# Patient Record
Sex: Female | Born: 1990 | Hispanic: No | Smoking: Never smoker
Health system: Southern US, Community
[De-identification: ages and names within clinical notes are randomized; demographics above are authoritative.]

---

## 2014-03-02 ENCOUNTER — Encounter: Payer: Self-pay | Admitting: Family

## 2014-03-02 ENCOUNTER — Other Ambulatory Visit (INDEPENDENT_AMBULATORY_CARE_PROVIDER_SITE_OTHER): Payer: Federal, State, Local not specified - PPO

## 2014-03-02 ENCOUNTER — Ambulatory Visit (INDEPENDENT_AMBULATORY_CARE_PROVIDER_SITE_OTHER): Payer: Federal, State, Local not specified - PPO | Admitting: Family

## 2014-03-02 VITALS — BP 112/78 | HR 87 | Temp 98.3°F | Resp 18 | Ht 59.0 in | Wt 135.1 lb

## 2014-03-02 DIAGNOSIS — R221 Localized swelling, mass and lump, neck: Secondary | ICD-10-CM

## 2014-03-02 LAB — T4, FREE: Free T4: 1.04 ng/dL (ref 0.60–1.60)

## 2014-03-02 LAB — TSH: TSH: 1.26 u[IU]/mL (ref 0.35–4.50)

## 2014-03-02 NOTE — Patient Instructions (Signed)
Thank you for choosing [facility].  Summary/Instructions:  Your prescription(s) have been submitted to your pharmacy or been printed and provided for you. Please take as directed and contact our office if you believe you are having problem(s) with the medication(s) or have any questions.  Please stop by the lab on the basement level of the building for your blood work. Your results will be released to MyChart (or called to you) after review, usually within 72 hours after test completion. If any changes need to be made, you will be notified at that same time.  Referrals have been made during this visit. You should expect to hear back from our schedulers in about 7-10 days in regards to establishing an appointment with the specialists we discussed.   If your symptoms worsen or fail to improve, please contact our office for further instruction, or in case of emergency go directly to the emergency room at the closest medical facility.    Goiter Goiter is an enlarged thyroid gland. The thyroid gland sits at the base of the front of the neck. The gland produces hormones that regulate mood, body temperature, pulse rate, and digestion. Most goiters are painless and are not a cause for serious concern. Goiters and conditions that cause goiters can be treated if necessary.  CAUSES  Common causes of goiter include:  Graves disease (causes too much hormone to be produced [hyperthyroidism]).  Hashimoto disease (causes too little hormone to be produced [hypothyroidism]).  Thyroiditis (inflammation of the thyroid sometimes caused by virus or pregnancy).  Nodular goiter (small bumps form; sometimes called toxic nodular goiter).  Pregnancy.  Thyroid cancer (very few goiters with nodules are cancerous).  Certain medications.  Radiation exposure.  Iodine deficiency (more common in developing countries in inland populations). RISK FACTORS Risk factors for goiter include:  A family history  of goiter.  Female gender.  Inadequate iodine in the diet.  Age older than 40 years. SYMPTOMS  Many goiters do not cause symptoms. When symptoms do occur, they may include:  Swelling in the lower part of the neck. This swelling can range from a very small bump to a large lump.  A tight feeling in the throat.  A hoarse voice. Less commonly, a goiter may result in:  Coughing.  Wheezing.  Difficulty swallowing.  Difficulty breathing.  Bulging neck veins.  Dizziness. When a goiter is the result of hyperthyroidism, symptoms may include:  Rapid or irregular heartbeat.  Sickness in your stomach (nausea).  Vomiting.  Diarrhea.  Shaking.  Irritable feeling.  Bulging eyes.  Weight loss.  Heat sensitivity.  Anxiety. When a goiter is the result of hypothyroidism, symptoms may include:  Tiredness.  Dry skin.  Constipation.  Weight gain.  Irregular menstrual cycle.  Depressed mood.  Sensitivity to cold. DIAGNOSIS  Tests used to diagnose goiter include:  A physical exam.  Blood tests, including thyroid hormone levels and antibody testing.  Ultrasonography, computerized X-ray scan (computed tomography, CT) or computerized magnetic scan (magnetic resonance imaging, MRI).  Thyroid scan (imaging along with safe radioactive injection).  Tissue sample taken (biopsy) of nodules. This is sometimes done to confirm that the nodules are not cancerous. TREATMENT  Treatment will depend on the cause of the goiter. Treatment may include:  Monitoring. In some cases, no treatment is necessary, and your doctor will monitor your condition at regular checkups.  Medications and supplements. Thyroid medication (thyroid hormone replacement) is available for hyperthyroidism and hypothyroidism.  If inflammation is the cause, over-the-counter medication  or steroid medication may be recommended.  Goiters caused by iodine deficiency can be treated with iodine supplements or  changes in diet.  Radioactive iodine treatment. Radioactive iodine is injected into the blood. It travels to the thyroid gland, kills thyroid cells, and reduces the size of the gland. This is only used when the thyroid gland is overactive. Lifelong thyroid hormone medication is often necessary after this treatment.  Surgery. A procedure to remove all or part of the gland may be recommended in severe cases or when cancer is the cause. Hormones can be taken to replace the hormones normally produced by the thyroid. HOME CARE INSTRUCTIONS   Take medications as directed.  Follow your caregiver's recommendations for any dietary changes.  Follow up with your caregiver for further examination and testing, as directed. PREVENTION   If you have a family history of goiter, discuss screening with your doctor.  Make sure you are getting enough iodine in your diet.  Use of iodized table salt can help prevent iodine deficiency. Document Released: 07/03/2009 Document Revised: 05/30/2013 Document Reviewed: 07/03/2009 [facility] Patient Information 2015 [place] [place]. This information is not intended to replace advice given to you by your health care provider. Make sure you discuss any questions you have with your health care provider.

## 2014-03-02 NOTE — Progress Notes (Signed)
   Subjective:    Patient ID: [place], female    DOB: January 26, 1991, 24 y.o.   MRN: [id number]  Chief Complaint  Patient presents with  . Establish Care    has lump in neck and wants to make sure it has nothing to do with thyroid, had TSH checked 2 weeks ago and it was normal    HPI:  Elizabeth NamStephanie Morken is a 24 y.o. female who presents today to establish care and discuss a lump in her neck.     Lump in neck - Associated symptom of a lump in the right side of her neck just superior to her clavical was noted about 3 weeks. Indicated that there has been pain, but the current pain intensity is 0/10. The size of the lump has been consistent and not really changed. Has been to Urgent Care where her TSH was normal. Denies changes to skin, hair or nails.    No Known Allergies  No current outpatient prescriptions on file prior to visit.   No current facility-administered medications on file prior to visit.    History reviewed. No pertinent past medical history.  History reviewed. No pertinent past surgical history.  Family History  Problem Relation Age of Onset  . Hyperlipidemia Mother   . Diabetes Mother   . Crohn's disease Father   . Heart disease Maternal Grandfather   . Goiter Paternal Grandmother     History   Social History  . Marital Status: Single    Spouse Name: N/A    Number of Children: 0  . Years of Education: 15   Occupational History  . 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 Smoking status: Never Smoker   . Smokeless tobacco: Never Used  . Alcohol Use: 0.0 oz/week    0 Not specified per week     Comment: Occasionally  . Drug Use: No  . Sexual Activity: Yes    Birth Control/ Protection: Pill   Other Topics Concern  . Not on file   Social History Narrative   [place]. Currently lives in an apartment with 1 roommate. No pets. Fun: video games.    Denies religious beliefs effecting health care.      Review of Systems  Constitutional: Negative for fever and chills.  Respiratory: Negative for choking and shortness of breath.   Endocrine: Negative for cold intolerance and heat intolerance.      Objective:    BP 112/78 mmHg  Pulse 87  Temp(Src) 98.3 F (36.8 C) (Oral)  Resp 18  Ht 4\' 11"  (1.499 m)  Wt 135 lb 1.9 oz (61.29 kg)  BMI 27.28 kg/m2  SpO2 98% Nursing note and vital signs reviewed.  Physical Exam  Constitutional: She is oriented to person, place, and time. She appears well-developed and well-nourished. No distress.  Neck: Thyroid mass present.    Cardiovascular: Normal rate, regular rhythm, normal heart sounds and intact distal pulses.   Pulmonary/Chest: Effort normal and breath sounds normal.  Neurological: She is alert and oriented to person, place, and time.  Skin: Skin is warm and dry.  Psychiatric: She has a normal mood and affect. Her behavior is normal. Judgment and thought content normal.       Assessment & Plan:

## 2014-03-02 NOTE — Progress Notes (Signed)
Pre visit review using our clinic review tool, if applicable. No additional management support is needed unless otherwise documented below in the visit note. 

## 2014-03-02 NOTE — Assessment & Plan Note (Signed)
Symptoms and exam consistent with potential goiter. Obtain TSH, T4, and neck ultrasound. Follow-up pending imaging and blood work.

## 2014-03-08 ENCOUNTER — Ambulatory Visit
Admission: RE | Admit: 2014-03-08 | Discharge: 2014-03-08 | Disposition: A | Discharge status: Home / Self Care | Payer: Federal, State, Local not specified - PPO | Source: Ambulatory Visit | Attending: Family | Admitting: Family

## 2014-03-08 DIAGNOSIS — R221 Localized swelling, mass and lump, neck: Secondary | ICD-10-CM

## 2014-03-09 ENCOUNTER — Encounter: Payer: Self-pay | Admitting: Family

## 2014-03-09 ENCOUNTER — Other Ambulatory Visit: Payer: Self-pay | Admitting: Family

## 2014-03-09 DIAGNOSIS — R221 Localized swelling, mass and lump, neck: Secondary | ICD-10-CM

## 2014-03-30 ENCOUNTER — Other Ambulatory Visit: Payer: Self-pay | Admitting: Family

## 2014-03-30 DIAGNOSIS — R221 Localized swelling, mass and lump, neck: Secondary | ICD-10-CM

## 2014-04-12 ENCOUNTER — Ambulatory Visit
Admission: RE | Admit: 2014-04-12 | Discharge: 2014-04-12 | Disposition: A | Discharge status: Home / Self Care | Payer: Federal, State, Local not specified - PPO | Source: Ambulatory Visit | Attending: Family | Admitting: Family

## 2014-04-12 ENCOUNTER — Other Ambulatory Visit (HOSPITAL_COMMUNITY)
Admission: RE | Admit: 2014-04-12 | Discharge: 2014-04-12 | Disposition: A | Discharge status: Home / Self Care | Payer: Federal, State, Local not specified - PPO | Source: Ambulatory Visit | Attending: Interventional Radiology | Admitting: Interventional Radiology

## 2014-04-12 DIAGNOSIS — E041 Nontoxic single thyroid nodule: Secondary | ICD-10-CM | POA: Diagnosis present

## 2014-04-12 DIAGNOSIS — R221 Localized swelling, mass and lump, neck: Secondary | ICD-10-CM

## 2014-04-26 ENCOUNTER — Encounter: Payer: Self-pay | Admitting: Family

## 2014-06-09 ENCOUNTER — Encounter: Payer: Self-pay | Admitting: Family

## 2014-07-05 ENCOUNTER — Other Ambulatory Visit: Payer: Self-pay

## 2016-12-14 IMAGING — US US SOFT TISSUE HEAD/NECK
1 series · 14 of 25 positions shown · non-contrast
Comparison: None.

CLINICAL DATA: Right neck mass.  Initial encounter.

EXAM:
THYROID ULTRASOUND
TECHNIQUE: Ultrasound examination of the thyroid gland and adjacent soft
tissues was performed.

[Series 1: us soft tissue head/neck · 0.07mm/px · 14 of 39 slices shown]
[im 1/39]
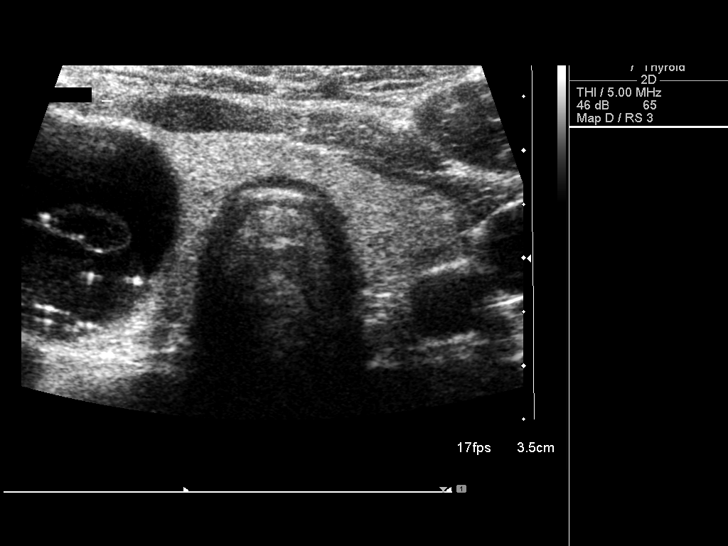
[im 4/39]
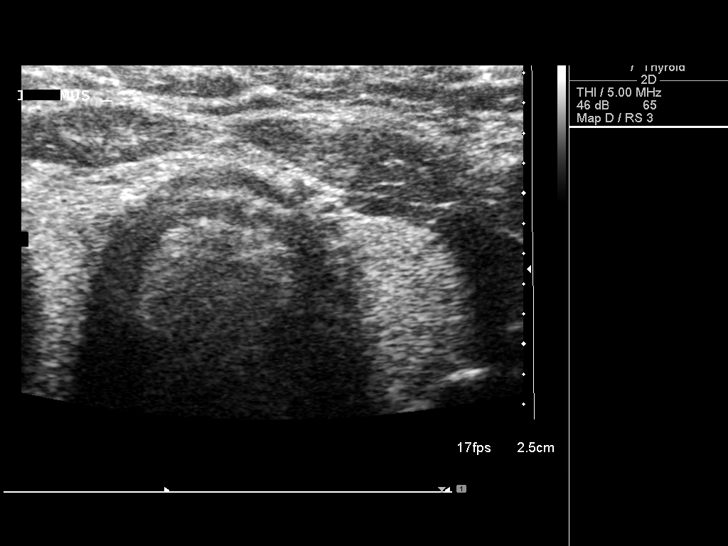
[im 7/39]
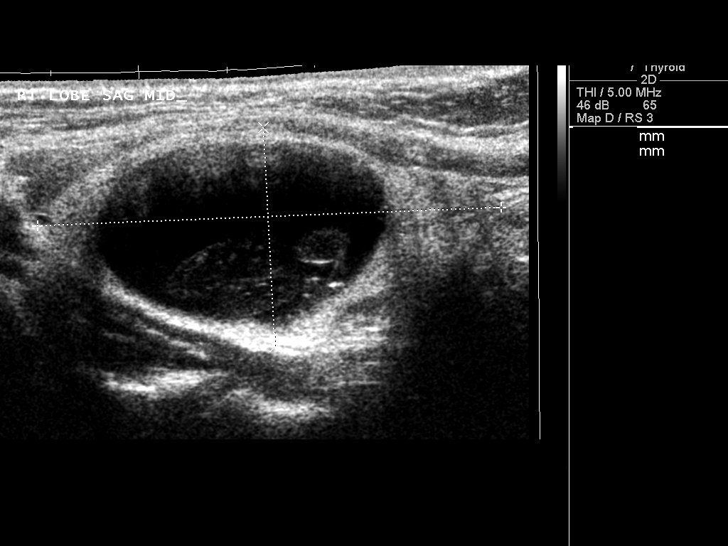
[im 10/39]
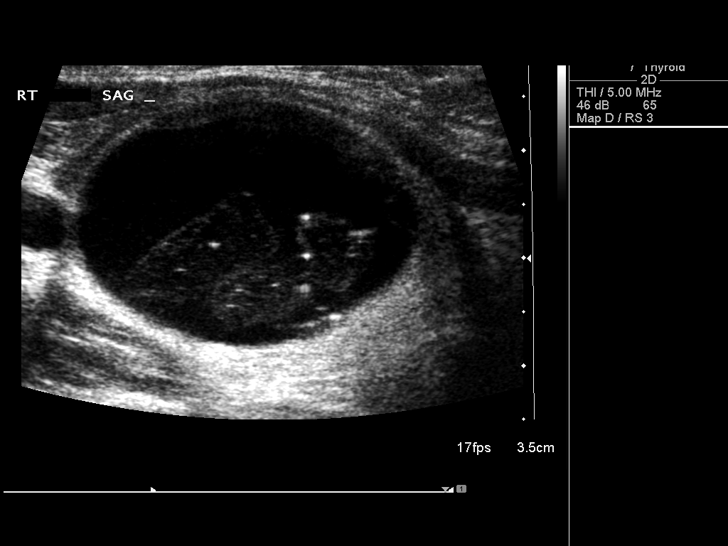
[im 13/39]
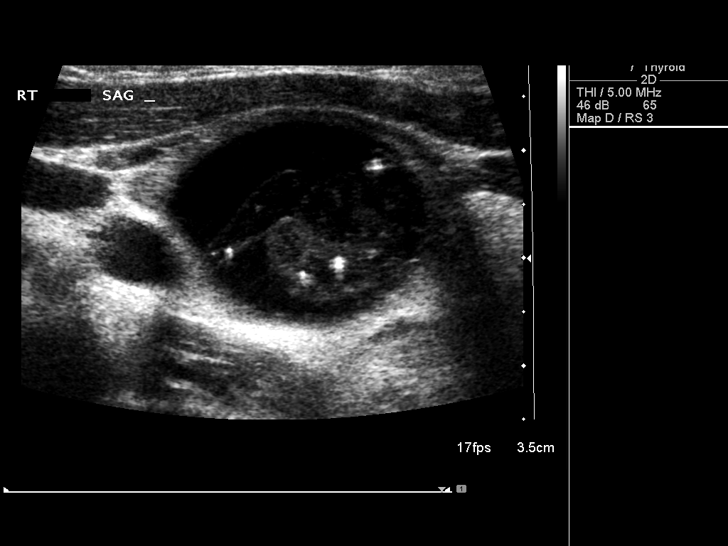
[im 15/39]
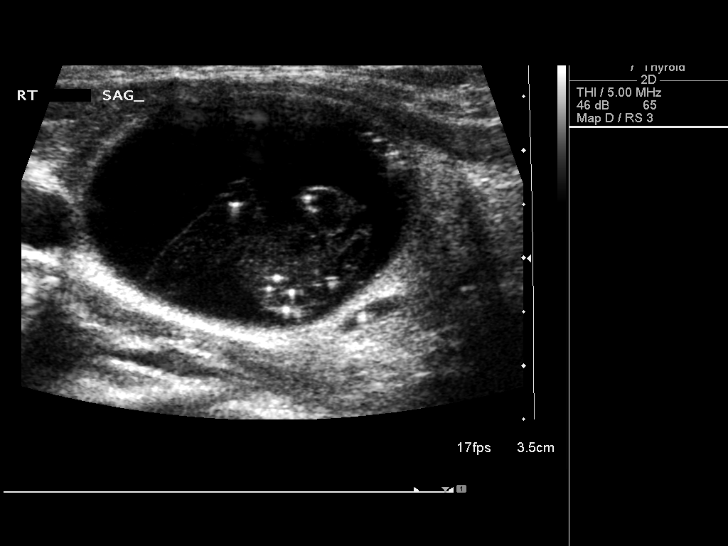
[im 18/39]
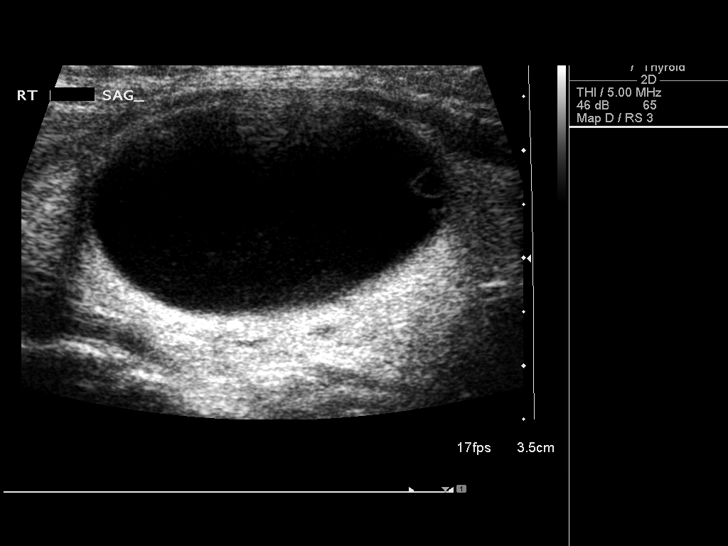
[im 21/39]
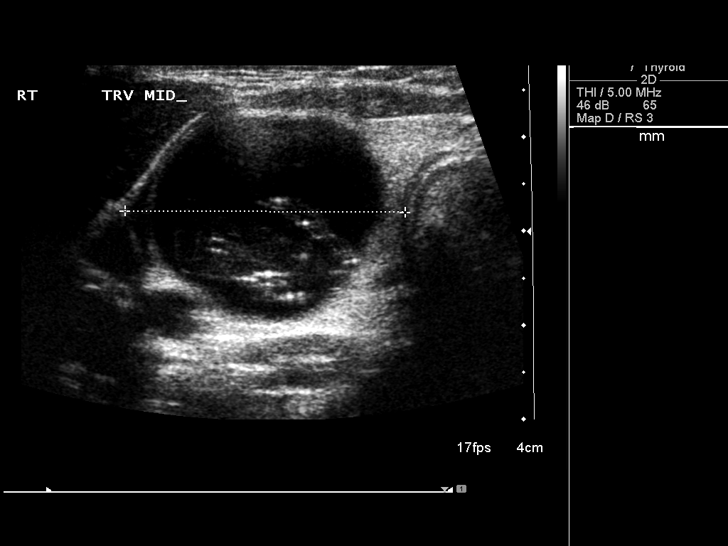
[im 24/39]
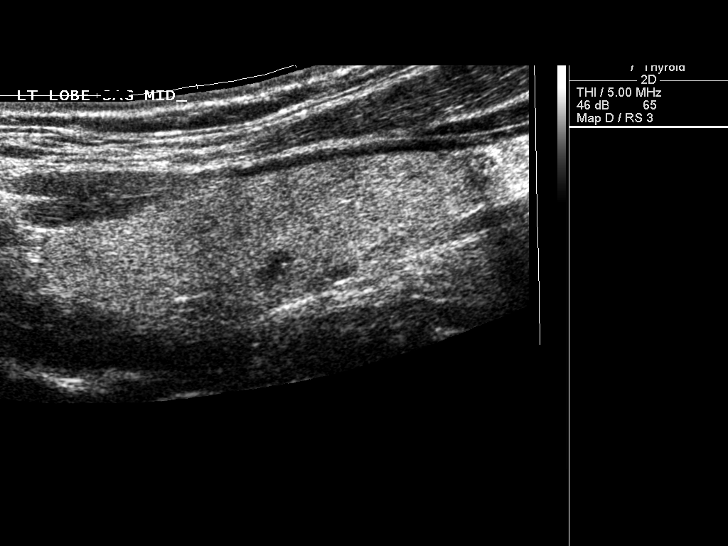
[im 26/39]
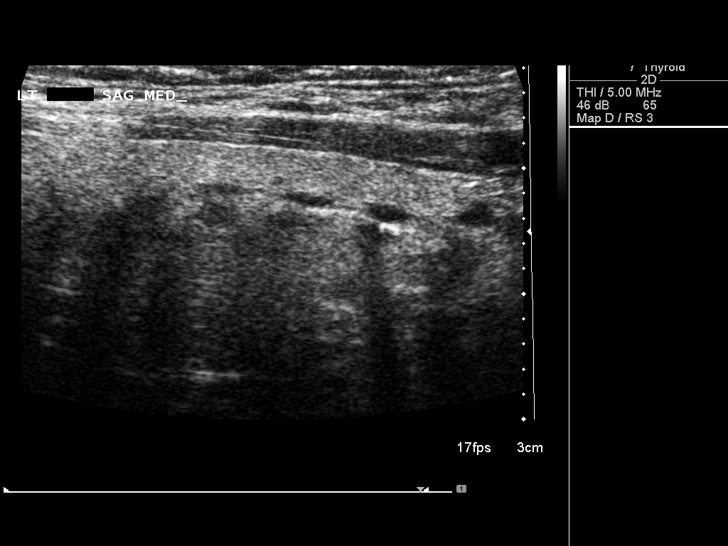
[im 29/39]
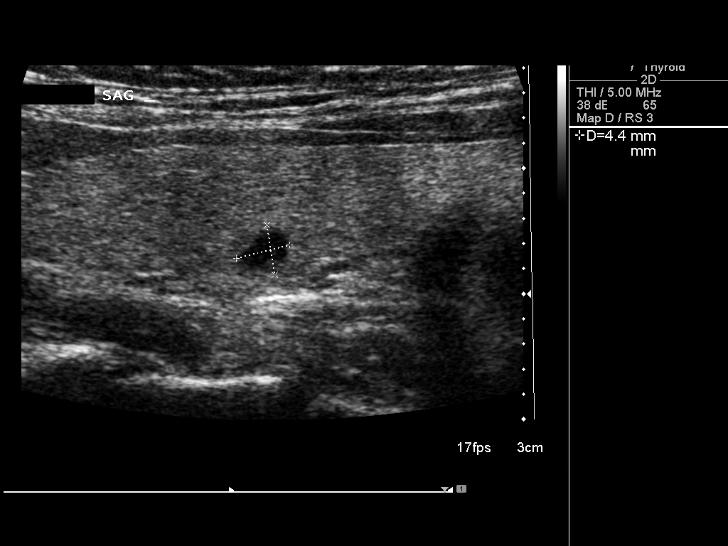
[im 32/39]
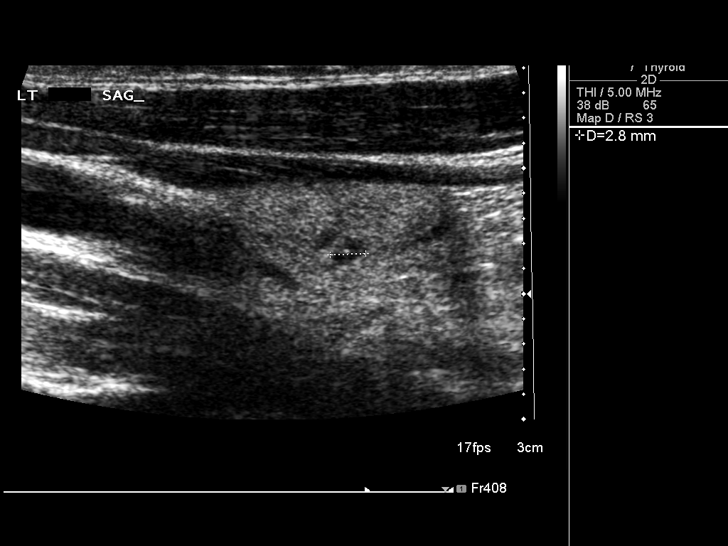
[im 35/39]
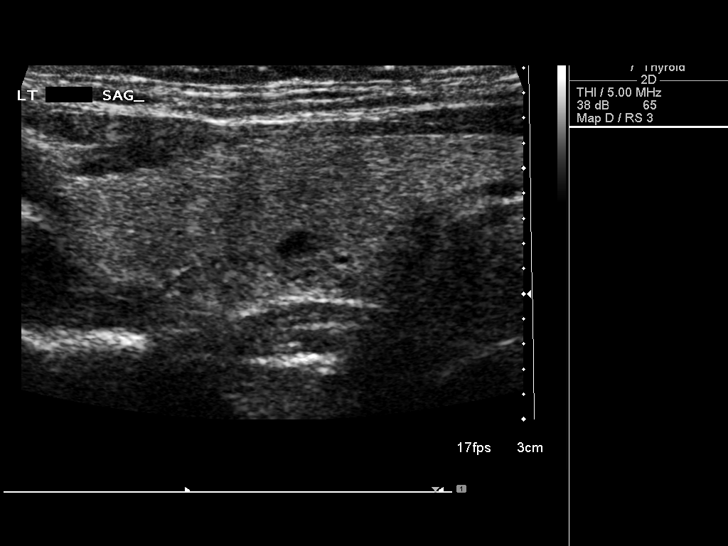
[im 39/39]
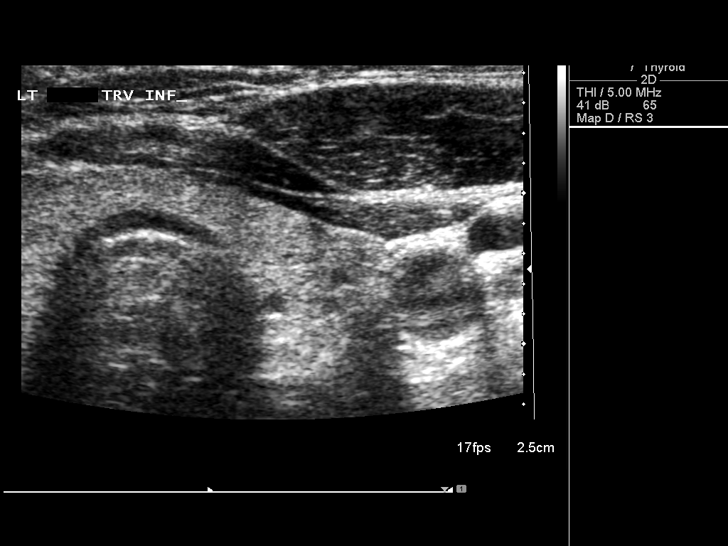

[14 of 25 positions shown; findings below may reference images not displayed]

FINDINGS: Right thyroid lobe

Measurements: 5.3 x 2.5 x 3.0 cm. Complex solid and cystic lesion
measures 3.2 x 2.3 x 2.6 cm. There are central echogenic areas.

Left thyroid lobe

Measurements: 4.7 x 1.4 x 1.3 cm. Tiny nodules 4 mm or less in
diameter.

Isthmus

Thickness: 4 mm.  No nodules visualized.

Lymphadenopathy

None visualized.
IMPRESSION: Bilateral thyroid lesions as described. 3.2 cm complex dominant
right lobe nodule. Findings meet consensus criteria for biopsy.
Ultrasound-guided fine needle aspiration should be considered, as
per the consensus statement: Management of Thyroid Nodules Detected
at US: Society of Radiologists in Ultrasound Consensus Conference

## 2017-01-18 IMAGING — US US THYROID BIOPSY
1 series · 12 of 12 positions shown · non-contrast
Comparison: Thyroid ultrasound on 03/08/2014

CLINICAL DATA: Complex cyst of the right lobe of the thyroid
previously measured at 3.2 cm in greatest diameter. Based on size
criteria, aspiration was recommended.

EXAM:
ULTRASOUND GUIDED NEEDLE ASPIRATE BIOPSY OF THE THYROID GLAND

[Series 1: us thyroid biopsy · 0.06mm/px · 12 acquisitions, 12 frames shown]
[im 1/12]
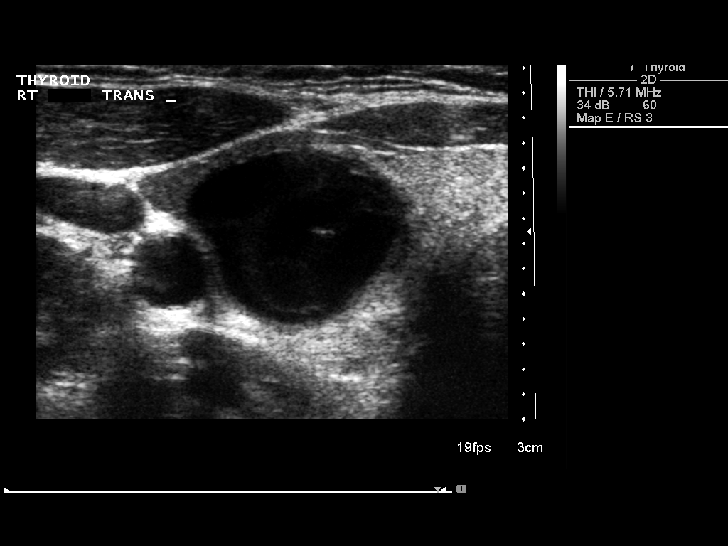
[im 2/12]
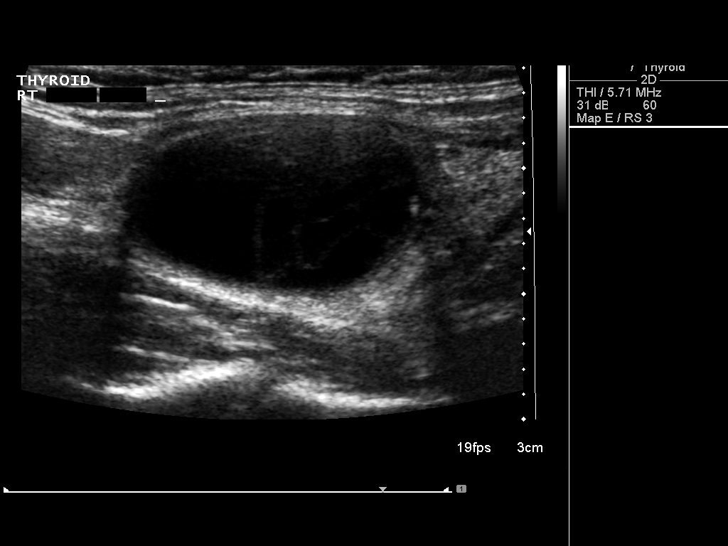
[im 3/12]
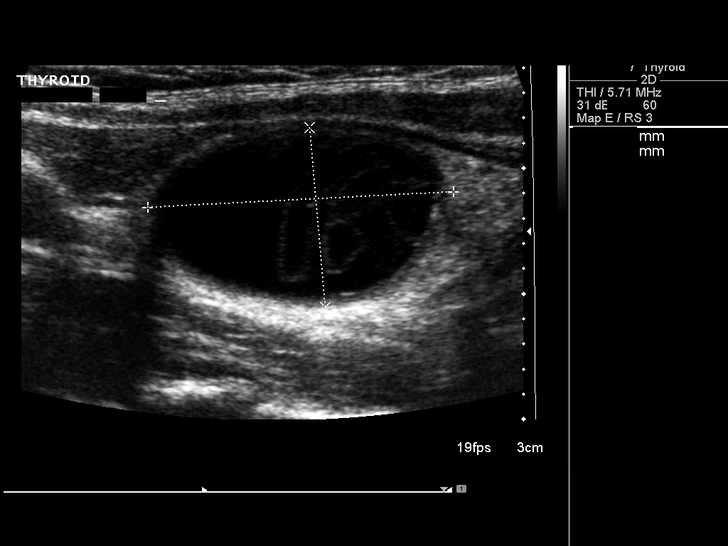
[im 4/12]
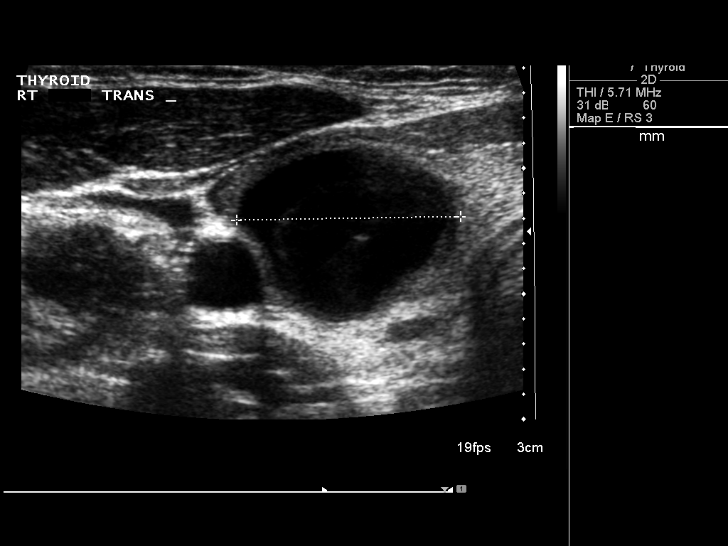
[im 5/12]
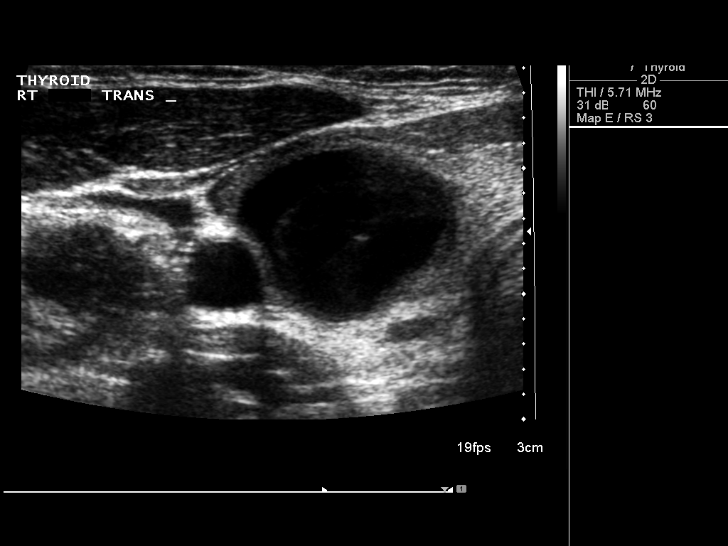
[im 6/12]
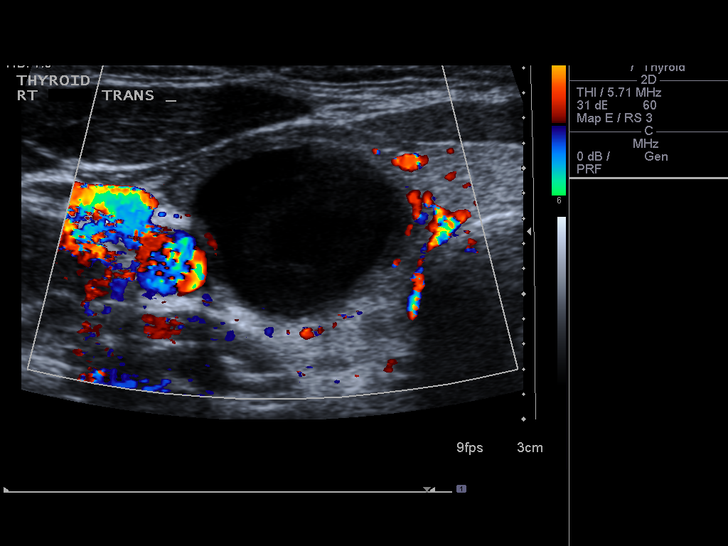
[im 7/12]
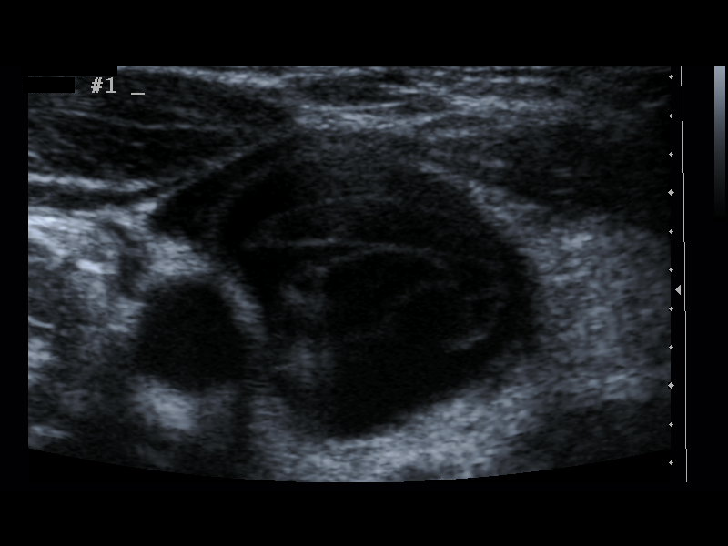
[im 8/12]
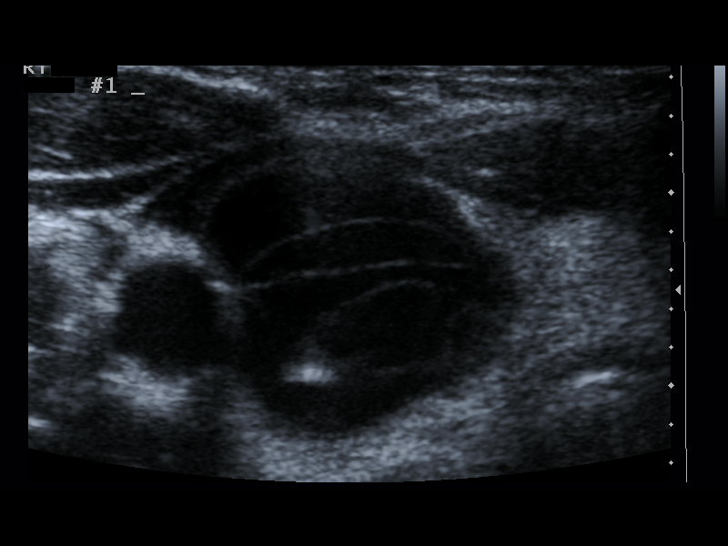
[im 9/12]
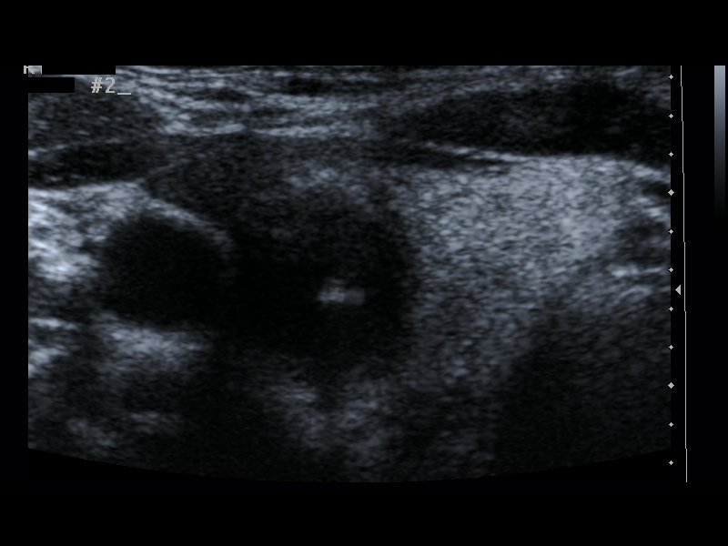
[im 10/12]
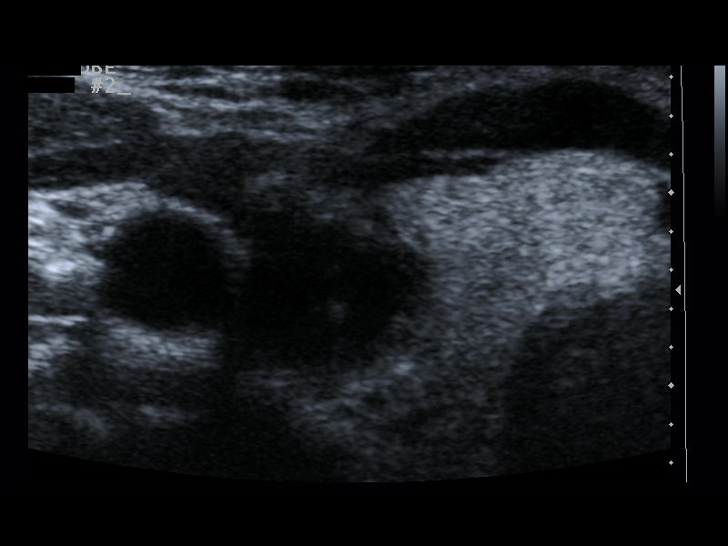
[im 11/12]
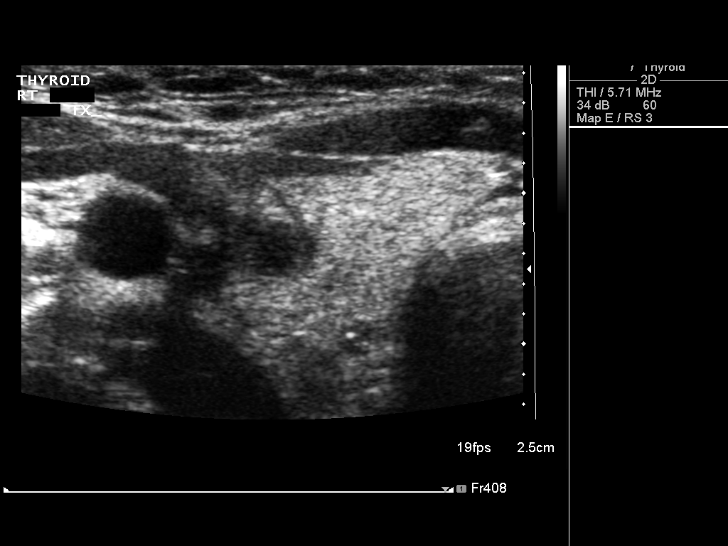
[im 12/12]
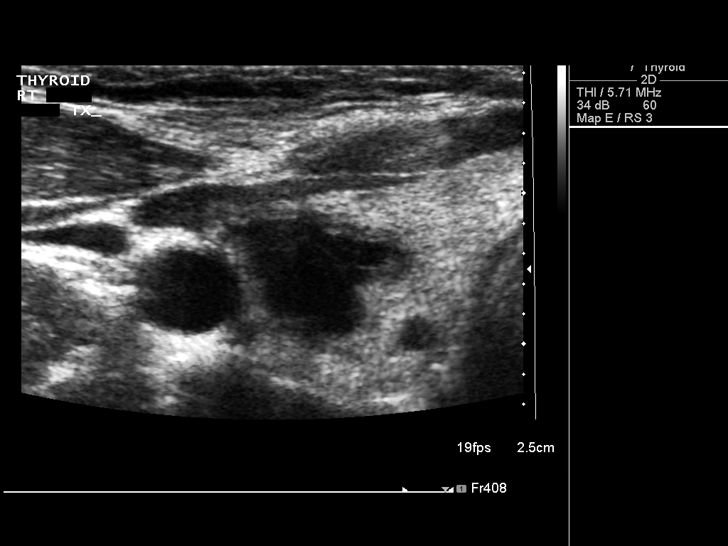

[12 of 12 positions shown; findings below may reference images not displayed]

PROCEDURE:
Thyroid biopsy was thoroughly discussed with the patient and
questions were answered. The benefits, risks, alternatives, and
complications were also discussed. The patient understands and
wishes to proceed with the procedure. Written consent was obtained.

Ultrasound was performed to localize and mark an adequate site for
the biopsy. The patient was then prepped and draped in a normal
sterile fashion. Local anesthesia was provided with 1% lidocaine.
Using direct ultrasound guidance, 2 passes were made using 25 gauge
needles into the nodule within the right lobe of the thyroid.
Ultrasound was used to confirm needle placements on all occasions.
Specimens were sent to Pathology for analysis.

COMPLICATIONS:
None
FINDINGS: By ultrasound today, the cystic abnormality in the right lobe is
smaller than on the previous diagnostic ultrasound, measuring 2.4 x
1.4 x 1.8 cm (previously 3.2 x 2.3 x 2.6 cm). Internal cyst contents
are also less complex in appearance. Given size, aspiration was
performed. This yielded approximately 3 mL of clear yellow colored
fluid and resulted in near complete collapse of the cyst.
IMPRESSION: Ultrasound-guided needle aspiration of complex cyst in the right
lobe of the thyroid gland. 3 mL of clear fluid was sent for
cytologic analysis.

## 2017-03-04 ENCOUNTER — Ambulatory Visit: Payer: Federal, State, Local not specified - PPO | Admitting: Family Medicine

## 2017-03-04 ENCOUNTER — Encounter: Payer: Self-pay | Admitting: Family Medicine

## 2017-03-04 VITALS — BP 124/78 | HR 108 | Temp 98.3°F | Ht 60.0 in | Wt 149.0 lb

## 2017-03-04 DIAGNOSIS — Z114 Encounter for screening for human immunodeficiency virus [HIV]: Secondary | ICD-10-CM

## 2017-03-04 DIAGNOSIS — R221 Localized swelling, mass and lump, neck: Secondary | ICD-10-CM

## 2017-03-04 DIAGNOSIS — Z13220 Encounter for screening for lipoid disorders: Secondary | ICD-10-CM | POA: Diagnosis not present

## 2017-03-04 DIAGNOSIS — Z0001 Encounter for general adult medical examination with abnormal findings: Secondary | ICD-10-CM

## 2017-03-04 DIAGNOSIS — R000 Tachycardia, unspecified: Secondary | ICD-10-CM | POA: Diagnosis not present

## 2017-03-04 NOTE — Progress Notes (Signed)
Subjective:  [facility] is a 27 y.o. female who presents today for her annual comprehensive physical exam.    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  [name].  She attended undergrad at [employer].  [place]   She has no acute complaints today.   Lifestyle Diet: No specific diets.  Exercise: No specific exercises.   Depression screen PHQ 2/9 03/04/2017  Decreased Interest 0  Down, Depressed, Hopeless 0  PHQ - 2 Score 0    Health Maintenance Due  Topic Date Due  . HIV Screening  03/27/2007     ROS: Per HPI, otherwise a 10 point review of systems was performed and was negative  PMH:  The following were reviewed and entered/updated in epic: History reviewed. No pertinent past medical history. Patient Active Problem List   Diagnosis Date Noted  . Lump in neck 03/02/2014   History reviewed. No pertinent surgical history.  Family History  Problem Relation Age of Onset  . Hyperlipidemia Mother   . Diabetes Mother   . Crohn's disease Father   . Heart disease Maternal Grandfather   . Goiter Paternal Grandmother    Medications- reviewed and updated No current outpatient medications on file.   No current facility-administered medications for this visit.    Allergies-reviewed and updated No Known Allergies  Social History   Socioeconomic History  . Marital status: Significant Other    Spouse name: None  . Number of children: 0  . Years of education: 8215  . Highest education level: None  Social Needs  . Financial resource strain: None  . Food insecurity - worry: None  . Food insecurity - inability: None  . Transportation needs - medical: None  . Transportation needs - non-medical: None  Occupational History  . 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 Smoking status: Never Smoker  . Smokeless tobacco: Never  Used  Substance and Sexual Activity  . Alcohol use: Yes    Alcohol/week: 0.0 oz    Comment: Occasionally  . Drug use: No  . Sexual activity: Yes    Birth control/protection: Pill  Other Topics Concern  . None  Social History Narrative   Born and raised in [place]. Currently lives in an apartment with 1 roommate. No pets. Fun: video games.    Denies religious beliefs effecting health care.    Objective:  Physical Exam: BP 124/78 (BP Location: Left Arm, Patient Position: Sitting, Cuff Size: Normal)   Pulse (!) 108   Temp 98.3 F (36.8 C) (Oral)   Ht 5' (1.524 m)   Wt 149 lb (67.6 kg)   SpO2 98%   BMI 29.10 kg/m   Body mass index is 29.1 kg/m. Wt Readings from Last 3 Encounters:  03/04/17 149 lb (67.6 kg)  03/02/14 135 lb 1.9 oz (61.3 kg)   Gen: NAD, resting comfortably HEENT: TMs normal bilaterally. OP clear. No thyromegaly noted.  CV: RRR with no murmurs appreciated Pulm: NWOB, CTAB with no crackles, wheezes, or rhonchi GI: Normal bowel sounds present. Soft, Nontender, Nondistended. MSK: no edema, cyanosis, or clubbing noted Skin: warm, dry Neuro: CN2-12 grossly intact. Strength 5/5 in upper and lower extremities. Reflexes symmetric and intact bilaterally.  Psych: Normal affect and thought content  Assessment/Plan:  Preventative Healthcare: Check lipid panel and HIV antibody.  Up-to-date on Pap, flu  shot, and tetanus vaccine.  Patient Counseling:  -Nutrition: Stressed importance of moderation in sodium/caffeine intake, saturated fat and cholesterol, caloric balance, sufficient intake of fresh fruits, vegetables, and fiber.  -Stressed the importance of regular exercise.   -Substance Abuse: Discussed cessation/primary prevention of tobacco, alcohol, or other drug use; driving or other dangerous activities under the influence; availability of treatment for abuse.   -Injury prevention: Discussed safety belts, safety helmets, smoke detector, smoking near bedding or  upholstery.   -Sexuality: Discussed sexually transmitted diseases, partner selection, use of condoms, avoidance of unintended pregnancy and contraceptive alternatives.   -Dental health: Discussed importance of regular tooth brushing, flossing, and dental visits.  -Health maintenance and immunizations reviewed. Please refer to Health maintenance section.  Return to care in 1 year for next preventative visit.   Katina Degree. Jimmey Ralph, MD 03/04/2017 11:31 AM

## 2017-03-04 NOTE — Patient Instructions (Signed)
Preventive Care 18-39 Years, Female Preventive care refers to lifestyle choices and visits with your health care provider that can promote health and wellness. What does preventive care include?  A yearly physical exam. This is also called an annual well check.  Dental exams once or twice a year.  Routine eye exams. Ask your health care provider how often you should have your eyes checked.  Personal lifestyle choices, including: ? Daily care of your teeth and gums. ? Regular physical activity. ? Eating a healthy diet. ? Avoiding tobacco and drug use. ? Limiting alcohol use. ? Practicing safe sex. ? Taking vitamin and mineral supplements as recommended by your health care provider. What happens during an annual well check? The services and screenings done by your health care provider during your annual well check will depend on your age, overall health, lifestyle risk factors, and family history of disease. Counseling Your health care provider may ask you questions about your:  Alcohol use.  Tobacco use.  Drug use.  Emotional well-being.  Home and relationship well-being.  Sexual activity.  Eating habits.  Work and work Statistician.  Method of birth control.  Menstrual cycle.  Pregnancy history.  Screening You may have the following tests or measurements:  Height, weight, and BMI.  Diabetes screening. This is done by checking your blood sugar (glucose) after you have not eaten for a while (fasting).  Blood pressure.  Lipid and cholesterol levels. These may be checked every 5 years starting at age 66.  Skin check.  Hepatitis C blood test.  Hepatitis B blood test.  Sexually transmitted disease (STD) testing.  BRCA-related cancer screening. This may be done if you have a family history of breast, ovarian, tubal, or peritoneal cancers.  Pelvic exam and Pap test. This may be done every 3 years starting at age 40. Starting at age 59, this may be done every 5  years if you have a Pap test in combination with an HPV test.  Discuss your test results, treatment options, and if necessary, the need for more tests with your health care provider. Vaccines Your health care provider may recommend certain vaccines, such as:  Influenza vaccine. This is recommended every year.  Tetanus, diphtheria, and acellular pertussis (Tdap, Td) vaccine. You may need a Td booster every 10 years.  Varicella vaccine. You may need this if you have not been vaccinated.  HPV vaccine. If you are 69 or younger, you may need three doses over 6 months.  Measles, mumps, and rubella (MMR) vaccine. You may need at least one dose of MMR. You may also need a second dose.  Pneumococcal 13-valent conjugate (PCV13) vaccine. You may need this if you have certain conditions and were not previously vaccinated.  Pneumococcal polysaccharide (PPSV23) vaccine. You may need one or two doses if you smoke cigarettes or if you have certain conditions.  Meningococcal vaccine. One dose is recommended if you are age 27-21 years and a first-year college student living in a residence hall, or if you have one of several medical conditions. You may also need additional booster doses.  Hepatitis A vaccine. You may need this if you have certain conditions or if you travel or work in places where you may be exposed to hepatitis A.  Hepatitis B vaccine. You may need this if you have certain conditions or if you travel or work in places where you may be exposed to hepatitis B.  Haemophilus influenzae type b (Hib) vaccine. You may need this if  you have certain risk factors.  Talk to your health care provider about which screenings and vaccines you need and how often you need them. This information is not intended to replace advice given to you by your health care provider. Make sure you discuss any questions you have with your health care provider. Document Released: 03/11/2001 Document Revised: 10/03/2015  Document Reviewed: 11/14/2014 Elsevier Interactive Patient Education  [employer].

## 2017-03-11 ENCOUNTER — Other Ambulatory Visit (INDEPENDENT_AMBULATORY_CARE_PROVIDER_SITE_OTHER): Payer: Federal, State, Local not specified - PPO

## 2017-03-11 DIAGNOSIS — Z13220 Encounter for screening for lipoid disorders: Secondary | ICD-10-CM

## 2017-03-11 DIAGNOSIS — R000 Tachycardia, unspecified: Secondary | ICD-10-CM | POA: Diagnosis not present

## 2017-03-11 DIAGNOSIS — Z114 Encounter for screening for human immunodeficiency virus [HIV]: Secondary | ICD-10-CM

## 2017-03-11 DIAGNOSIS — R221 Localized swelling, mass and lump, neck: Secondary | ICD-10-CM

## 2017-03-11 LAB — COMPREHENSIVE METABOLIC PANEL
ALBUMIN: 4.2 g/dL (ref 3.5–5.2)
ALT: 8 U/L (ref 0–35)
AST: 14 U/L (ref 0–37)
Alkaline Phosphatase: 55 U/L (ref 39–117)
BILIRUBIN TOTAL: 0.5 mg/dL (ref 0.2–1.2)
BUN: 13 mg/dL (ref 6–23)
CO2: 26 mEq/L (ref 19–32)
CREATININE: 0.74 mg/dL (ref 0.40–1.20)
Calcium: 9.1 mg/dL (ref 8.4–10.5)
Chloride: 103 mEq/L (ref 96–112)
GFR: 101.67 mL/min (ref >60.00)
GLUCOSE: 95 mg/dL (ref 70–99)
POTASSIUM: 3.9 meq/L (ref 3.5–5.1)
SODIUM: 137 meq/L (ref 135–145)
TOTAL PROTEIN: 8.1 g/dL (ref 6.0–8.3)

## 2017-03-11 LAB — CBC
HCT: 43.3 % (ref 36.0–46.0)
Hemoglobin: 14.7 g/dL (ref 12.0–15.0)
MCHC: 34 g/dL (ref 30.0–36.0)
MCV: 93.2 fl (ref 78.0–100.0)
Platelets: 316 10*3/uL (ref 150.0–400.0)
RBC: 4.65 Mil/uL (ref 3.87–5.11)
RDW: 13.5 % (ref 11.5–15.5)
WBC: 6.2 10*3/uL (ref 4.0–10.5)

## 2017-03-11 LAB — LIPID PANEL
CHOLESTEROL: 157 mg/dL (ref 0–200)
HDL: 49.1 mg/dL (ref >39.00)
LDL Cholesterol: 98 mg/dL (ref 0–99)
NONHDL: 107.6
Total CHOL/HDL Ratio: 3
Triglycerides: 47 mg/dL (ref 0.0–149.0)
VLDL: 9.4 mg/dL (ref 0.0–40.0)

## 2017-03-11 LAB — TSH: TSH: 1.18 u[IU]/mL (ref 0.35–4.50)

## 2017-03-12 LAB — HIV ANTIBODY (ROUTINE TESTING W REFLEX): HIV 1&2 Ab, 4th Generation: NONREACTIVE

## 2017-03-12 NOTE — Progress Notes (Signed)
Dr Lavone NeriParker's interpretation of your lab work:  Your blood work is all normal. We do not need to make any changes today. We should plan on rechecking again in about 5 years.    If you have any additional questions, please give us a call or send us a message through [facility].  Take care, Dr Jimmey RalphParker

## 2018-04-27 ENCOUNTER — Telehealth: Payer: Federal, State, Local not specified - PPO | Admitting: Nurse Practitioner

## 2018-04-27 DIAGNOSIS — J00 Acute nasopharyngitis [common cold]: Secondary | ICD-10-CM

## 2018-04-27 NOTE — Progress Notes (Signed)
E-Visit for Corona Virus Screening  Based on your current symptoms, it seems unlikely that your symptoms are related to the Coronavirus.   Coronavirus disease 2019 (COVID-19) is a respiratory illness that can spread from person to person. The virus that causes COVID-19 is a new virus that was first identified in the country of [place] but is now found in multiple other countries and has spread to the [place].  Symptoms associated with the virus are mild to severe fever, cough, and shortness of breath. There is currently no vaccine to protect against COVID-19, and there is no specific antiviral treatment for the virus.   To be considered HIGH RISK for Coronavirus (COVID-19), you have to meet the following criteria:  . Traveled to [place], [place], [place], [place] or [place]; or in the [place] to [place], [place], [place], or [place]; and have fever, cough, and shortness of breath within the last 2 weeks of travel OR  . Been in close contact with a person diagnosed with COVID-19 within the last 2 weeks and have fever, cough, and shortness of breath  . IF YOU DO NOT MEET THESE CRITERIA, YOU ARE CONSIDERED LOW RISK FOR COVID-19.   It is vitally important that if you feel that you have an infection such as this virus or any other virus that you stay home and away from places where you may spread it to others.  You should self-quarantine for 14 days if you have symptoms that could potentially be coronavirus and avoid contact with people age 95 and older.   You can use medication such as delsym or mucinx OTC for cough  You may also take acetaminophen (Tylenol) as needed for fever.   Reduce your risk of any infection by using the same precautions used for avoiding the common cold or flu:  Marland Kitchen Wash your hands often with soap and warm water for at least 20 seconds.  If soap and water are not readily available, use an alcohol-based hand sanitizer with at least 60% alcohol.  . If coughing or  sneezing, cover your mouth and nose by coughing or sneezing into the elbow areas of your shirt or coat, into a tissue or into your sleeve (not your hands). . Avoid shaking hands with others and consider head nods or verbal greetings only. . Avoid touching your eyes, nose, or mouth with unwashed hands.  . Avoid close contact with people who are sick. . Avoid places or events with large numbers of people in one location, like concerts or sporting events. . Carefully consider travel plans you have or are making. . If you are planning any travel outside or inside the [place], visit the CDC's Travelers' Health webpage for the latest health notices. . If you have some symptoms but not all symptoms, continue to monitor at home and seek medical attention if your symptoms worsen. . If you are having a medical emergency, call 911.  HOME CARE . Only take medications as instructed by your medical team. . Drink plenty of fluids and get plenty of rest. . A steam or ultrasonic humidifier can help if you have congestion.   GET HELP RIGHT AWAY IF: . You develop worsening fever. . You become short of breath . You cough up blood. . Your symptoms become more severe MAKE SURE YOU   Understand these instructions.  Will watch your condition.  Will get help right away if you are not doing well or get worse.  Your e-visit answers  were reviewed by a board certified advanced clinical practitioner to complete your personal care plan.  Depending on the condition, your plan could have included both over the counter or prescription medications.  If there is a problem please reply once you have received a response from your provider. Your safety is important to us.  If you have drug allergies check your prescription carefully.    You can use MyChart to ask questions about today's visit, request a non-urgent call back, or ask for a work or school excuse for 24 hours related to this e-Visit. If it has been greater than 24  hours you will need to follow up with your provider, or enter a new e-Visit to address those concerns. You will get an e-mail in the next two days asking about your experience.  I hope that your e-visit has been valuable and will speed your recovery. Thank you for using e-visits.   5 minutes spent reviewing and documenting in chart.  

## 2019-03-01 ENCOUNTER — Ambulatory Visit (INDEPENDENT_AMBULATORY_CARE_PROVIDER_SITE_OTHER): Payer: Self-pay | Admitting: Family Medicine

## 2019-03-01 ENCOUNTER — Encounter: Payer: Self-pay | Admitting: Family Medicine

## 2019-03-01 ENCOUNTER — Other Ambulatory Visit: Payer: Self-pay

## 2019-03-01 VITALS — BP 108/66 | HR 79 | Temp 97.0°F | Ht 60.0 in | Wt 150.0 lb

## 2019-03-01 DIAGNOSIS — E663 Overweight: Secondary | ICD-10-CM

## 2019-03-01 DIAGNOSIS — Z6829 Body mass index (BMI) 29.0-29.9, adult: Secondary | ICD-10-CM

## 2019-03-01 DIAGNOSIS — Z0001 Encounter for general adult medical examination with abnormal findings: Secondary | ICD-10-CM

## 2019-03-01 LAB — COMPREHENSIVE METABOLIC PANEL
ALT: 11 U/L (ref 0–35)
AST: 15 U/L (ref 0–37)
Albumin: 4.2 g/dL (ref 3.5–5.2)
Alkaline Phosphatase: 57 U/L (ref 39–117)
BUN: 14 mg/dL (ref 6–23)
CO2: 29 mEq/L (ref 19–32)
Calcium: 9.2 mg/dL (ref 8.4–10.5)
Chloride: 102 mEq/L (ref 96–112)
Creatinine, Ser: 0.72 mg/dL (ref 0.40–1.20)
GFR: 97.22 mL/min (ref >60.00)
Glucose, Bld: 88 mg/dL (ref 70–99)
Potassium: 3.8 mEq/L (ref 3.5–5.1)
Sodium: 138 mEq/L (ref 135–145)
Total Bilirubin: 0.3 mg/dL (ref 0.2–1.2)
Total Protein: 7.8 g/dL (ref 6.0–8.3)

## 2019-03-01 LAB — LIPID PANEL
Cholesterol: 179 mg/dL (ref 0–200)
HDL: 57.7 mg/dL (ref >39.00)
LDL Cholesterol: 112 mg/dL — ABNORMAL HIGH (ref 0–99)
NonHDL: 120.87
Total CHOL/HDL Ratio: 3
Triglycerides: 43 mg/dL (ref 0.0–149.0)
VLDL: 8.6 mg/dL (ref 0.0–40.0)

## 2019-03-01 LAB — CBC
HCT: 40.7 % (ref 36.0–46.0)
Hemoglobin: 13.8 g/dL (ref 12.0–15.0)
MCHC: 34 g/dL (ref 30.0–36.0)
MCV: 95.8 fl (ref 78.0–100.0)
Platelets: 274 10*3/uL (ref 150.0–400.0)
RBC: 4.24 Mil/uL (ref 3.87–5.11)
RDW: 13.8 % (ref 11.5–15.5)
WBC: 6.5 10*3/uL (ref 4.0–10.5)

## 2019-03-01 LAB — TSH: TSH: 1.87 u[IU]/mL (ref 0.35–4.50)

## 2019-03-01 NOTE — Patient Instructions (Signed)
It was very nice to see you today!  Keep up the good work!  We will check blood work today.  Come back in 1 year for your next physical, or sooner if needed.   Take care, Dr Jerline Pain  Please try these tips to maintain a healthy lifestyle:   Eat at least 3 REAL meals and 1-2 snacks per day.  Aim for no more than 5 hours between eating.  If you eat breakfast, please do so within one hour of getting up.    Each meal should contain half fruits/vegetables, one quarter protein, and one quarter carbs (no bigger than a computer mouse)   Cut down on sweet beverages. This includes juice, soda, and sweet tea.     Drink at least 1 glass of water with each meal and aim for at least 8 glasses per day   Exercise at least 150 minutes every week.    Preventive Care 39-65 Years Old, Female Preventive care refers to visits with your health care provider and lifestyle choices that can promote health and wellness. This includes:  A yearly physical exam. This may also be called an annual well check.  Regular dental visits and eye exams.  Immunizations.  Screening for certain conditions.  Healthy lifestyle choices, such as eating a healthy diet, getting regular exercise, not using drugs or products that contain nicotine and tobacco, and limiting alcohol use. What can I expect for my preventive care visit? Physical exam Your health care provider will check your:  Height and weight. This may be used to calculate body mass index (BMI), which tells if you are at a healthy weight.  Heart rate and blood pressure.  Skin for abnormal spots. Counseling Your health care provider may ask you questions about your:  Alcohol, tobacco, and drug use.  Emotional well-being.  Home and relationship well-being.  Sexual activity.  Eating habits.  Work and work Statistician.  Method of birth control.  Menstrual cycle.  Pregnancy history. What immunizations do I need?  Influenza (flu)  vaccine  This is recommended every year. Tetanus, diphtheria, and pertussis (Tdap) vaccine  You may need a Td booster every 10 years. Varicella (chickenpox) vaccine  You may need this if you have not been vaccinated. Human papillomavirus (HPV) vaccine  If recommended by your health care provider, you may need three doses over 6 months. Measles, mumps, and rubella (MMR) vaccine  You may need at least one dose of MMR. You may also need a second dose. Meningococcal conjugate (MenACWY) vaccine  One dose is recommended if you are age 34-21 years and a first-year college student living in a residence hall, or if you have one of several medical conditions. You may also need additional booster doses. Pneumococcal conjugate (PCV13) vaccine  You may need this if you have certain conditions and were not previously vaccinated. Pneumococcal polysaccharide (PPSV23) vaccine  You may need one or two doses if you smoke cigarettes or if you have certain conditions. Hepatitis A vaccine  You may need this if you have certain conditions or if you travel or work in places where you may be exposed to hepatitis A. Hepatitis B vaccine  You may need this if you have certain conditions or if you travel or work in places where you may be exposed to hepatitis B. Haemophilus influenzae type b (Hib) vaccine  You may need this if you have certain conditions. You may receive vaccines as individual doses or as more than one vaccine together in  one shot (combination vaccines). Talk with your health care provider about the risks and benefits of combination vaccines. What tests do I need?  Blood tests  Lipid and cholesterol levels. These may be checked every 5 years starting at age 30.  Hepatitis C test.  Hepatitis B test. Screening  Diabetes screening. This is done by checking your blood sugar (glucose) after you have not eaten for a while (fasting).  Sexually transmitted disease (STD)  testing.  BRCA-related cancer screening. This may be done if you have a family history of breast, ovarian, tubal, or peritoneal cancers.  Pelvic exam and Pap test. This may be done every 3 years starting at age 20. Starting at age 59, this may be done every 5 years if you have a Pap test in combination with an HPV test. Talk with your health care provider about your test results, treatment options, and if necessary, the need for more tests. Follow these instructions at home: Eating and drinking   Eat a diet that includes fresh fruits and vegetables, whole grains, lean protein, and low-fat dairy.  Take vitamin and mineral supplements as recommended by your health care provider.  Do not drink alcohol if: ? Your health care provider tells you not to drink. ? You are pregnant, may be pregnant, or are planning to become pregnant.  If you drink alcohol: ? Limit how much you have to 0-1 drink a day. ? Be aware of how much alcohol is in your drink. In the [place] one drink equals one 12 oz bottle of beer (355 mL), one 5 oz glass of wine (148 mL), or one 1 oz glass of hard liquor (44 mL). Lifestyle  Take daily care of your teeth and gums.  Stay active. Exercise for at least 30 minutes on 5 or more days each week.  Do not use any products that contain nicotine or tobacco, such as cigarettes, e-cigarettes, and chewing tobacco. If you need help quitting, ask your health care provider.  If you are sexually active, practice safe sex. Use a condom or other form of birth control (contraception) in order to prevent pregnancy and STIs (sexually transmitted infections). If you plan to become pregnant, see your health care provider for a preconception visit. What's next?  Visit your health care provider once a year for a well check visit.  Ask your health care provider how often you should have your eyes and teeth checked.  Stay up to date on all vaccines. This information is not intended to replace  advice given to you by your health care provider. Make sure you discuss any questions you have with your health care provider. Document Revised: 09/24/2017 Document Reviewed: 09/24/2017 Elsevier Patient Education  2020 [employer].

## 2019-03-01 NOTE — Progress Notes (Signed)
Chief Complaint:  Elizabeth Greene is a 29 y.o. female who presents today for her annual comprehensive physical exam.    Assessment/Plan:  Chronic Problems Addressed Today: Body mass index is 29.29 kg/m. / Overweight BMI Metric Follow Up - 03/01/19 0934      BMI Metric Follow Up-Please document annually   BMI Metric Follow Up  Education provided       Preventative Healthcare: Check CBC, CMET, TSH, lipid panel. Has already received covid vaccine. UTD on other vaccines and screenings.   Patient Counseling(The following topics were reviewed and/or handout was given):  -Nutrition: Stressed importance of moderation in sodium/caffeine intake, saturated fat and cholesterol, caloric balance, sufficient intake of fresh fruits, vegetables, and fiber.  -Stressed the importance of regular exercise.   -Substance Abuse: Discussed cessation/primary prevention of tobacco, alcohol, or other drug use; driving or other dangerous activities under the influence; availability of treatment for abuse.   -Injury prevention: Discussed safety belts, safety helmets, smoke detector, smoking near bedding or upholstery.   -Sexuality: Discussed sexually transmitted diseases, partner selection, use of condoms, avoidance of unintended pregnancy and contraceptive alternatives.   -Dental health: Discussed importance of regular tooth brushing, flossing, and dental visits.  -Health maintenance and immunizations reviewed. Please refer to Health maintenance section.  Return to care in 1 year for next preventative visit.     Subjective:  HPI:  She has no acute complaints today.   Lifestyle Diet: None specific.  Exercise: None specific.   Depression screen PHQ 2/9 03/01/2019  Decreased Interest 0  Down, Depressed, Hopeless 0  PHQ - 2 Score 0   There are no preventive care reminders to display for this patient.   ROS: Per HPI, otherwise a complete review of systems was negative.   PMH:  The following were  reviewed and entered/updated in epic: No past medical history on file. There are no problems to display for this patient.  No past surgical history on file.  Family History  Problem Relation Age of Onset  . Hyperlipidemia Mother   . Diabetes Mother   . Crohn's disease Father   . Heart disease Maternal Grandfather   . Goiter Paternal Grandmother     Medications- reviewed and updated No current outpatient medications on file.   No current facility-administered medications for this visit.    Allergies-reviewed and updated No Known Allergies  Social History   Socioeconomic History  . Marital status: Significant Other    Spouse name: Not on file  . Number of children: 0  . Years of education: 38  . Highest education level: Not on file  Occupational History  . 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 Smoking status: Never Smoker  . Smokeless tobacco: Never Used  Substance and Sexual Activity  . Alcohol use: Yes    Alcohol/week: 0.0 standard drinks    Comment: Occasionally  . Drug use: No  . Sexual activity: Yes    Birth control/protection: Pill  Other Topics Concern  . Not on file  Social History Narrative   Born and raised in [place]. Currently lives in an apartment with 1 roommate. No pets. Fun: video games.    Denies religious beliefs effecting health care.    Social Determinants of Health   Financial Resource Strain:   . Difficulty of Paying Living Expenses: Not on file  Food Insecurity:   . Worried About Programme researcher, broadcasting/film/video in the Last Year: Not on file  .  Ran Out of Food in the Last Year: Not on file  Transportation Needs:   . Lack of Transportation (Medical): Not on file  . Lack of Transportation (Non-Medical): Not on file  Physical Activity:   . Days of Exercise per Week: Not on file  . Minutes of Exercise per Session: Not on file  Stress:   . Feeling of Stress : Not on file  Social Connections:   . Frequency of  Communication with Friends and Family: Not on file  . Frequency of Social Gatherings with Friends and Family: Not on file  . Attends Religious Services: Not on file  . Active Member of Clubs or Organizations: Not on file  . Attends Archivist Meetings: Not on file  . Marital Status: Not on file        Objective:  Physical Exam: BP 108/66   Pulse 79   Temp (!) 97 F (36.1 C)   Ht 5' (1.524 m)   Wt 150 lb (68 kg)   LMP 02/25/2019   SpO2 99%   BMI 29.29 kg/m   Body mass index is 29.29 kg/m. Wt Readings from Last 3 Encounters:  03/01/19 150 lb (68 kg)  03/04/17 149 lb (67.6 kg)  03/02/14 135 lb 1.9 oz (61.3 kg)   Gen: NAD, resting comfortably HEENT: TMs normal bilaterally. OP clear. No thyromegaly noted.  CV: RRR with no murmurs appreciated Pulm: NWOB, CTAB with no crackles, wheezes, or rhonchi GI: Normal bowel sounds present. Soft, Nontender, Nondistended. MSK: no edema, cyanosis, or clubbing noted Skin: warm, dry Neuro: CN2-12 grossly intact. Strength 5/5 in upper and lower extremities. Reflexes symmetric and intact bilaterally.  Psych: Normal affect and thought content     Azreal Stthomas M. Jerline Pain, MD 03/01/2019 9:35 AM

## 2019-03-03 ENCOUNTER — Encounter: Payer: Self-pay | Admitting: Family Medicine

## 2019-03-03 DIAGNOSIS — E7800 Pure hypercholesterolemia, unspecified: Secondary | ICD-10-CM | POA: Insufficient documentation

## 2019-03-03 NOTE — Progress Notes (Signed)
Please inform patient of the following:  Cholesterol up slightly but everything else is NORMAL. Recommend that she keep up the good work with diet/exericse and we can recheck in a year or so.  Elizabeth Greene. Jimmey Ralph, MD 03/03/2019 12:25 PM

## 2020-09-13 ENCOUNTER — Other Ambulatory Visit: Payer: Self-pay

## 2020-09-13 ENCOUNTER — Other Ambulatory Visit (HOSPITAL_COMMUNITY)
Admission: RE | Admit: 2020-09-13 | Discharge: 2020-09-13 | Disposition: A | Discharge status: Home / Self Care | Payer: No Typology Code available for payment source | Source: Ambulatory Visit | Attending: Family Medicine | Admitting: Family Medicine

## 2020-09-13 ENCOUNTER — Ambulatory Visit (INDEPENDENT_AMBULATORY_CARE_PROVIDER_SITE_OTHER): Payer: No Typology Code available for payment source | Admitting: Family Medicine

## 2020-09-13 ENCOUNTER — Encounter: Payer: Self-pay | Admitting: Family Medicine

## 2020-09-13 VITALS — BP 118/75 | HR 88 | Temp 98.0°F | Ht 60.0 in | Wt 153.0 lb

## 2020-09-13 DIAGNOSIS — Z6829 Body mass index (BMI) 29.0-29.9, adult: Secondary | ICD-10-CM | POA: Diagnosis not present

## 2020-09-13 DIAGNOSIS — E663 Overweight: Secondary | ICD-10-CM

## 2020-09-13 DIAGNOSIS — Z124 Encounter for screening for malignant neoplasm of cervix: Secondary | ICD-10-CM | POA: Insufficient documentation

## 2020-09-13 DIAGNOSIS — Z23 Encounter for immunization: Secondary | ICD-10-CM | POA: Diagnosis not present

## 2020-09-13 DIAGNOSIS — Z0001 Encounter for general adult medical examination with abnormal findings: Secondary | ICD-10-CM | POA: Insufficient documentation

## 2020-09-13 DIAGNOSIS — E7800 Pure hypercholesterolemia, unspecified: Secondary | ICD-10-CM | POA: Diagnosis not present

## 2020-09-13 LAB — COMPREHENSIVE METABOLIC PANEL
ALT: 8 U/L (ref 0–35)
AST: 14 U/L (ref 0–37)
Albumin: 4.1 g/dL (ref 3.5–5.2)
Alkaline Phosphatase: 48 U/L (ref 39–117)
BUN: 16 mg/dL (ref 6–23)
CO2: 27 mEq/L (ref 19–32)
Calcium: 9.5 mg/dL (ref 8.4–10.5)
Chloride: 102 mEq/L (ref 96–112)
Creatinine, Ser: 0.81 mg/dL (ref 0.40–1.20)
GFR: 98.75 mL/min (ref >60.00)
Glucose, Bld: 96 mg/dL (ref 70–99)
Potassium: 3.7 mEq/L (ref 3.5–5.1)
Sodium: 137 mEq/L (ref 135–145)
Total Bilirubin: 0.4 mg/dL (ref 0.2–1.2)
Total Protein: 7.9 g/dL (ref 6.0–8.3)

## 2020-09-13 LAB — CBC
HCT: 42 % (ref 36.0–46.0)
Hemoglobin: 13.9 g/dL (ref 12.0–15.0)
MCHC: 33 g/dL (ref 30.0–36.0)
MCV: 98.7 fl (ref 78.0–100.0)
Platelets: 273 10*3/uL (ref 150.0–400.0)
RBC: 4.26 Mil/uL (ref 3.87–5.11)
RDW: 13.4 % (ref 11.5–15.5)
WBC: 6.5 10*3/uL (ref 4.0–10.5)

## 2020-09-13 LAB — TSH: TSH: 2.2 u[IU]/mL (ref 0.35–5.50)

## 2020-09-13 LAB — LIPID PANEL
Cholesterol: 165 mg/dL (ref 0–200)
HDL: 60.5 mg/dL (ref >39.00)
LDL Cholesterol: 97 mg/dL (ref 0–99)
NonHDL: 104.39
Total CHOL/HDL Ratio: 3
Triglycerides: 39 mg/dL (ref 0.0–149.0)
VLDL: 7.8 mg/dL (ref 0.0–40.0)

## 2020-09-13 NOTE — Assessment & Plan Note (Signed)
Check labs 

## 2020-09-13 NOTE — Progress Notes (Signed)
Chief Complaint:  Elizabeth Greene is a 30 y.o. female who presents today for her annual comprehensive physical exam.    Assessment/Plan:  Chronic Problems Addressed Today: No problem-specific Assessment & Plan notes found for this encounter.   There is no height or weight on file to calculate BMI. / Overweight    Preventative Healthcare: Tdap given today. Getting blood work done today and Pap smear.   Patient Counseling(The following topics were reviewed and/or handout was given):  -Nutrition: Stressed importance of moderation in sodium/caffeine intake, saturated fat and cholesterol, caloric balance, sufficient intake of fresh fruits, vegetables, and fiber.  -Stressed the importance of regular exercise.   -Substance Abuse: Discussed cessation/primary prevention of tobacco, alcohol, or other drug use; driving or other dangerous activities under the influence; availability of treatment for abuse.   -Injury prevention: Discussed safety belts, safety helmets, smoke detector, smoking near bedding or upholstery.   -Sexuality: Discussed sexually transmitted diseases, partner selection, use of condoms, avoidance of unintended pregnancy and contraceptive alternatives.   -Dental health: Discussed importance of regular tooth brushing, flossing, and dental visits.  -Health maintenance and immunizations reviewed. Please refer to Health maintenance section.  Return to care in 1 year for next preventative visit.     Subjective:  HPI:  She has no acute complaints today.   Lifestyle Diet: Balanced:  Exercise: None specific  Depression screen PHQ 2/9 03/01/2019  Decreased Interest 0  Down, Depressed, Hopeless 0  PHQ - 2 Score 0    Health Maintenance Due  Topic Date Due   Hepatitis C Screening  Never done   PAP SMEAR-Modifier  11/27/2019   TETANUS/TDAP  07/15/2020   INFLUENZA VACCINE  08/27/2020   PAP-Cervical Cytology Screening  11/10/2020     ROS: Per HPI, otherwise a complete  review of systems was negative.   PMH:  The following were reviewed and entered/updated in epic: No past medical history on file. Patient Active Problem List   Diagnosis Date Noted   Elevated LDL cholesterol level 03/03/2019   No past surgical history on file.  Family History  Problem Relation Age of Onset   Hyperlipidemia Mother    Diabetes Mother    Crohn's disease Father    Heart disease Maternal Grandfather    Goiter Paternal Grandmother     Medications- reviewed and updated No current outpatient medications on file.   No current facility-administered medications for this visit.    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 Currently lives in an apartment with 1 roommate. No pets. Fun: video games.    Denies religious beliefs effecting health care.    Social Determinants of Health   Financial Resource Strain: Not on file  Food Insecurity: Not on file  Transportation Needs: Not on file  Physical Activity: Not on file  Stress: Not on file  Social Connections: Not on file        Objective:  Physical Exam: There were no vitals taken for this visit.  There is no height or weight on file to calculate BMI. Wt Readings from Last 3 Encounters:  03/01/19 150 lb (68 kg)  03/04/17 149 lb (67.6 kg)  03/02/14 135 lb 1.9 oz (61.3 kg)   Gen: NAD,  resting comfortably HEENT: TMs normal bilaterally. OP clear. No thyromegaly noted.  CV: RRR with no murmurs appreciated Pulm: NWOB, CTAB with no crackles, wheezes, or rhonchi GI: Normal bowel sounds present. Soft, Nontender, Nondistended. GU: Normal external and internal female genitalia.  Chaperone present for exam. MSK: no edema, cyanosis, or clubbing noted Skin: warm, dry Neuro: CN2-12 grossly intact. Strength 5/5 in upper and lower extremities. Reflexes symmetric and intact bilaterally.  Psych: Normal affect and thought content      I,Savera Zaman,acting as a scribe for Jacquiline Doe, MD.,have documented all relevant documentation on the behalf of Jacquiline Doe, MD,as directed by  Jacquiline Doe, MD while in the presence of Jacquiline Doe, MD.  I, Jacquiline Doe, MD, have reviewed all documentation for this visit. The documentation on 09/13/20 for the exam, diagnosis, procedures, and orders are all accurate and complete.  Katina Degree. Jimmey Ralph, MD 09/13/2020 7:51 AM

## 2020-09-13 NOTE — Patient Instructions (Signed)
It was very nice to see you today!  We will do blood work today.  We did your Pap test today.  I will see back in year.  Please come back to see me sooner if needed.  Take care, Dr Jerline Pain  PLEASE NOTE:  If you had any lab tests please let us know if you have not heard back within a few days. You may see your results on mychart before we have a chance to review them but we will give you a call once they are reviewed by [place]. If we ordered any referrals today, please let us know if you have not heard from their office within the next week.   Please try these tips to maintain a healthy lifestyle:  Eat at least 3 REAL meals and 1-2 snacks per day.  Aim for no more than 5 hours between eating.  If you eat breakfast, please do so within one hour of getting up.   Each meal should contain half fruits/vegetables, one quarter protein, and one quarter carbs (no bigger than a computer mouse)  Cut down on sweet beverages. This includes juice, soda, and sweet tea.   Drink at least 1 glass of water with each meal and aim for at least 8 glasses per day  Exercise at least 150 minutes every week.    Preventive Care 76-47 Years Old, Female Preventive care refers to lifestyle choices and visits with your health care provider that can promote health and wellness. This includes: A yearly physical exam. This is also called an annual wellness visit. Regular dental and eye exams. Immunizations. Screening for certain conditions. Healthy lifestyle choices, such as: Eating a healthy diet. Getting regular exercise. Not using drugs or products that contain nicotine and tobacco. Limiting alcohol use. What can I expect for my preventive care visit? Physical exam Your health care provider may check your: Height and weight. These may be used to calculate your BMI (body mass index). BMI is a measurement that tells if you are at a healthy weight. Heart rate and blood pressure. Body temperature. Skin for abnormal  spots. Counseling Your health care provider may ask you questions about your: Past medical problems. Family's medical history. Alcohol, tobacco, and drug use. Emotional well-being. Home life and relationship well-being. Sexual activity. Diet, exercise, and sleep habits. Work and work Statistician. Access to firearms. Method of birth control. Menstrual cycle. Pregnancy history. What immunizations do I need?  Vaccines are usually given at various ages, according to a schedule. Your health care provider will recommend vaccines for you based on your age, medicalhistory, and lifestyle or other factors, such as travel or where you work. What tests do I need?  Blood tests Lipid and cholesterol levels. These may be checked every 5 years starting at age 14. Hepatitis C test. Hepatitis B test. Screening Diabetes screening. This is done by checking your blood sugar (glucose) after you have not eaten for a while (fasting). STD (sexually transmitted disease) testing, if you are at risk. BRCA-related cancer screening. This may be done if you have a family history of breast, ovarian, tubal, or peritoneal cancers. Pelvic exam and Pap test. This may be done every 3 years starting at age 58. Starting at age 70, this may be done every 5 years if you have a Pap test in combination with an HPV test. Talk with your health care provider about your test results, treatment options,and if necessary, the need for more tests. Follow these instructions at home: Eating  and drinking  Eat a healthy diet that includes fresh fruits and vegetables, whole grains, lean protein, and low-fat dairy products. Take vitamin and mineral supplements as recommended by your health care provider. Do not drink alcohol if: Your health care provider tells you not to drink. You are pregnant, may be pregnant, or are planning to become pregnant. If you drink alcohol: Limit how much you have to 0-1 drink a day. Be aware of how much  alcohol is in your drink. In the [place] one drink equals one 12 oz bottle of beer (355 mL), one 5 oz glass of wine (148 mL), or one 1 oz glass of hard liquor (44 mL).  Lifestyle Take daily care of your teeth and gums. Brush your teeth every morning and night with fluoride toothpaste. Floss one time each day. Stay active. Exercise for at least 30 minutes 5 or more days each week. Do not use any products that contain nicotine or tobacco, such as cigarettes, e-cigarettes, and chewing tobacco. If you need help quitting, ask your health care provider. Do not use drugs. If you are sexually active, practice safe sex. Use a condom or other form of protection to prevent STIs (sexually transmitted infections). If you do not wish to become pregnant, use a form of birth control. If you plan to become pregnant, see your health care provider for a prepregnancy visit. Find healthy ways to cope with stress, such as: Meditation, yoga, or listening to music. Journaling. Talking to a trusted person. Spending time with friends and family. Safety Always wear your seat belt while driving or riding in a vehicle. Do not drive: If you have been drinking alcohol. Do not ride with someone who has been drinking. When you are tired or distracted. While texting. Wear a helmet and other protective equipment during sports activities. If you have firearms in your house, make sure you follow all gun safety procedures. Seek help if you have been physically or sexually abused. What's next? Go to your health care provider once a year for an annual wellness visit. Ask your health care provider how often you should have your eyes and teeth checked. Stay up to date on all vaccines. This information is not intended to replace advice given to you by your health care provider. Make sure you discuss any questions you have with your healthcare provider. Document Revised: 09/11/2019 Document Reviewed: 09/24/2017 [employer] Patient  Education  2022 [employer].

## 2020-09-14 LAB — CYTOLOGY - PAP: Diagnosis: NEGATIVE

## 2020-09-14 NOTE — Progress Notes (Signed)
Please inform patient of the following:  Labs and her Pap smear are all normal.  We should repeat her Pap smear in 3 years.  We can recheck blood work again in a few years.  She should continue working on diet and exercise.

## 2021-10-21 ENCOUNTER — Encounter: Payer: Self-pay | Admitting: *Deleted
